# Patient Record
Sex: Male | Born: 1960 | Race: White | Hispanic: No
Health system: Southern US, Community
[De-identification: ages and names within clinical notes are randomized; demographics above are authoritative.]

---

## 2006-03-08 ENCOUNTER — Emergency Department: Payer: Self-pay | Admitting: Emergency Medicine

## 2008-09-15 IMAGING — CT CT ABD-PELV W/O CM
1 of 2 series · 15 of 32 positions shown, 19 images · non-contrast
Comparison: none

REASON FOR EXAM: (1) left flank pain r/o stone -rm 7; (2) left flank pain
r/o stone-rm 7
COMMENTS:

PROCEDURE:     CT  - CT ABDOMEN AND PELVIS W[DATE] [DATE]
RESULT:
HISTORY: LEFT flank pain.

[Series 2: soft tissue · axial · 0.79mm/px · z∈[-682,-226]mm · 15 of 63 slices shown, 19 images]
[im 3/63  soft-tissue]
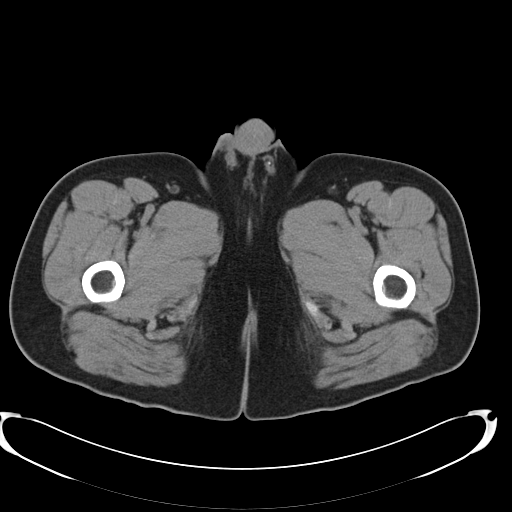
[im 3/63  bone]
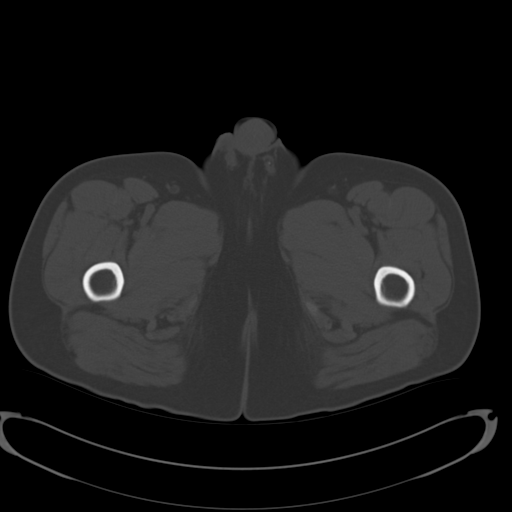
[im 9/63  soft-tissue]
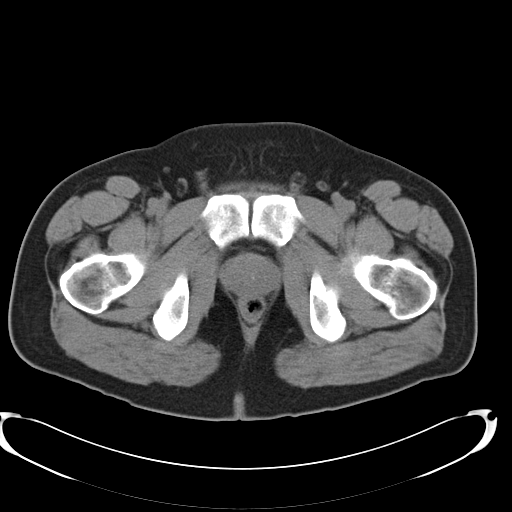
[im 14/63  soft-tissue]
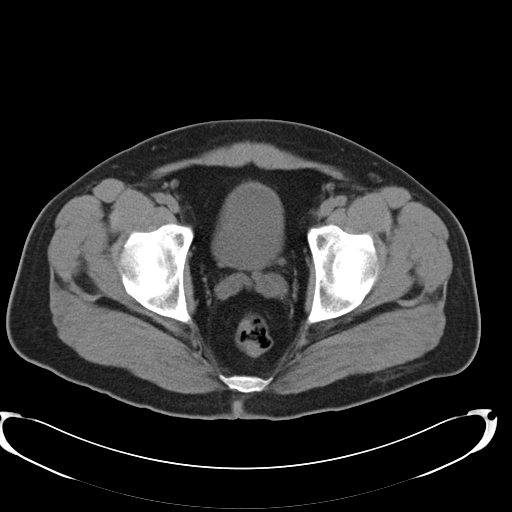
[im 17/63  soft-tissue]
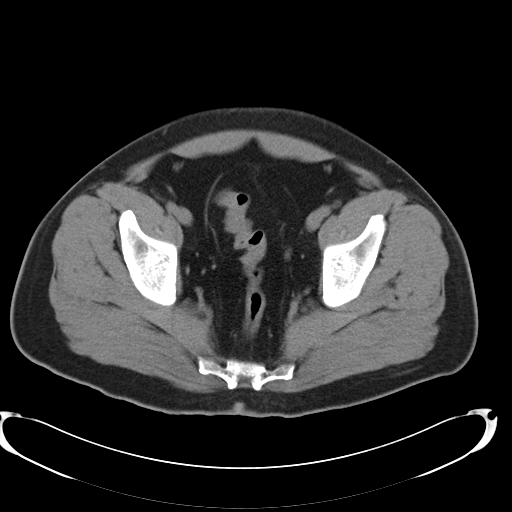
[im 22/63  soft-tissue]
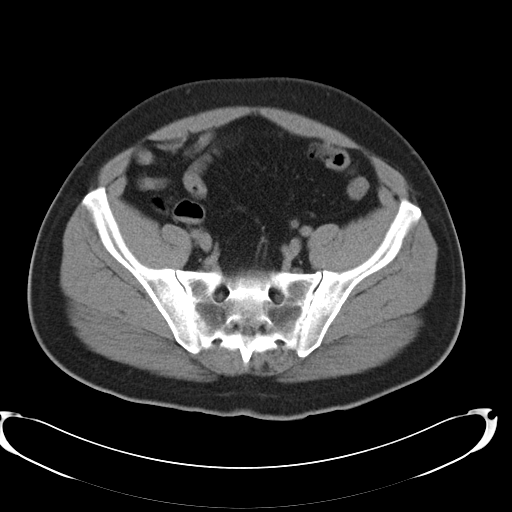
[im 27/63  soft-tissue]
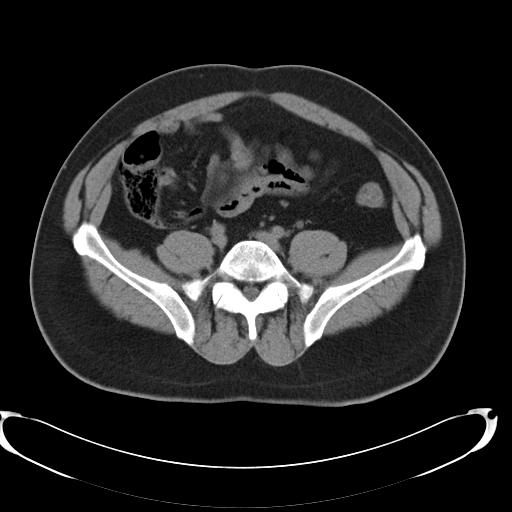
[im 33/63  soft-tissue]
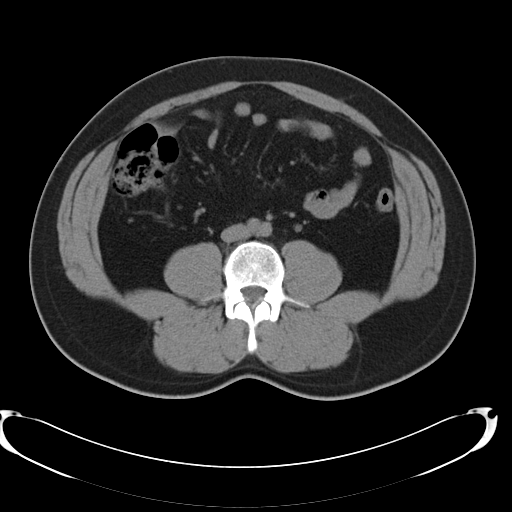
[im 36/63  soft-tissue]
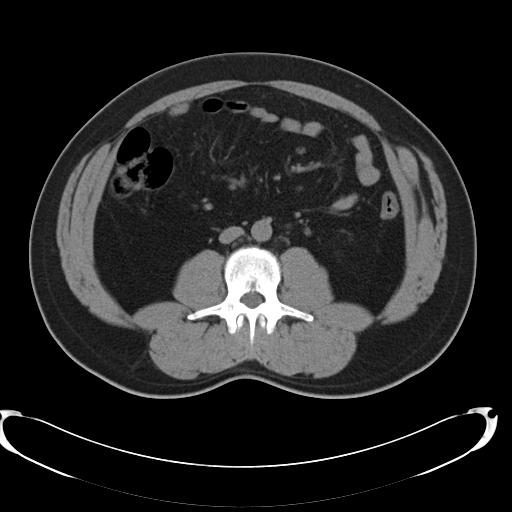
[im 41/63  soft-tissue]
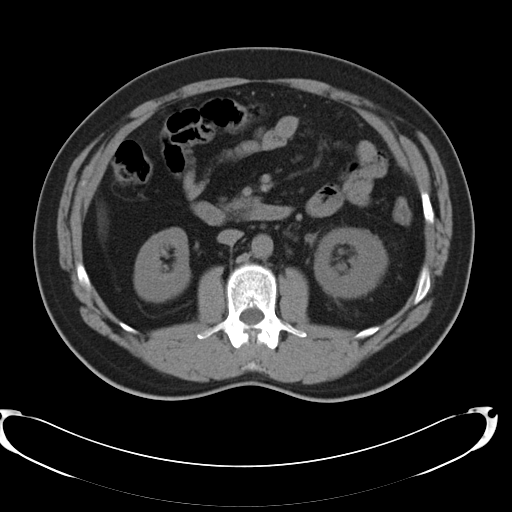
[im 41/63  bone]
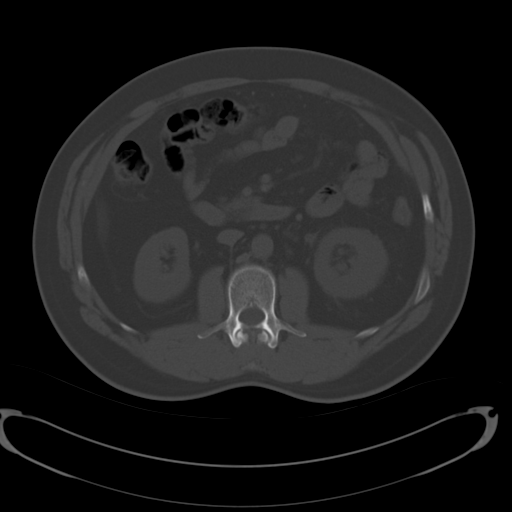
[im 46/63  soft-tissue]
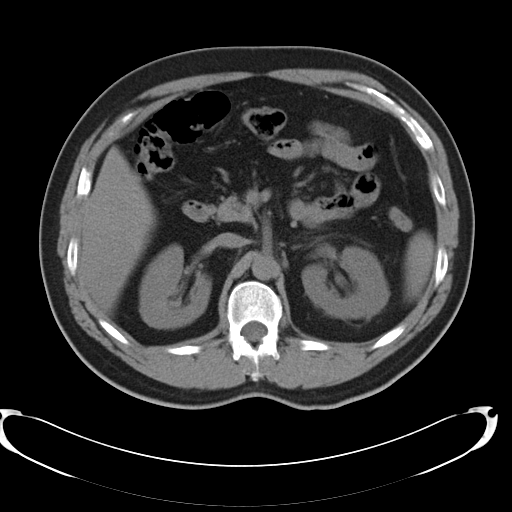
[im 49/63  soft-tissue]
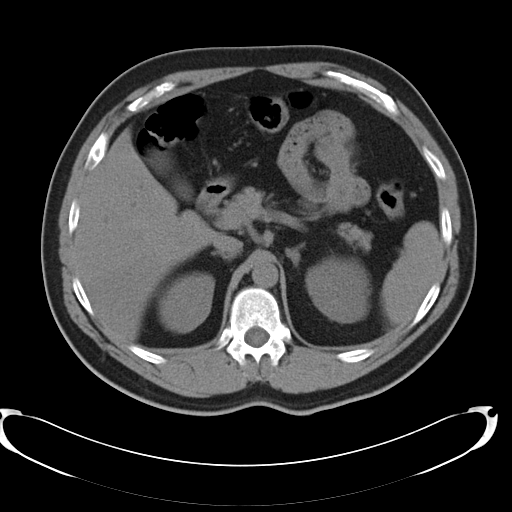
[im 52/63  lung]
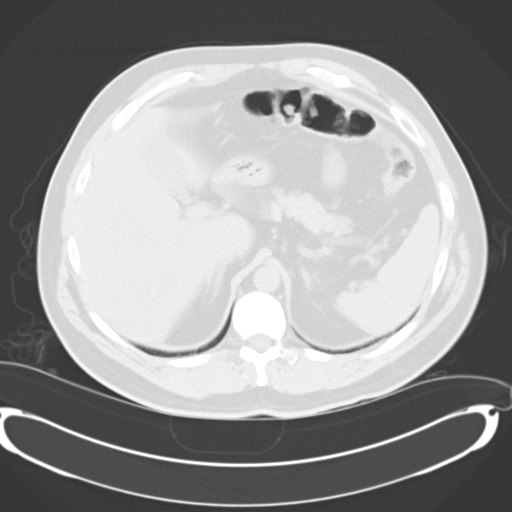
[im 54/63  soft-tissue]
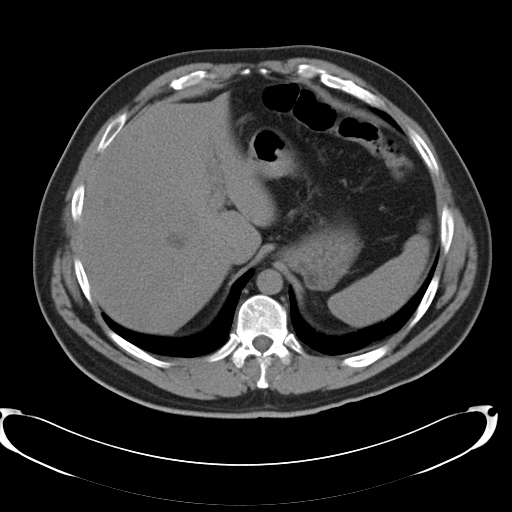
[im 54/63  lung]
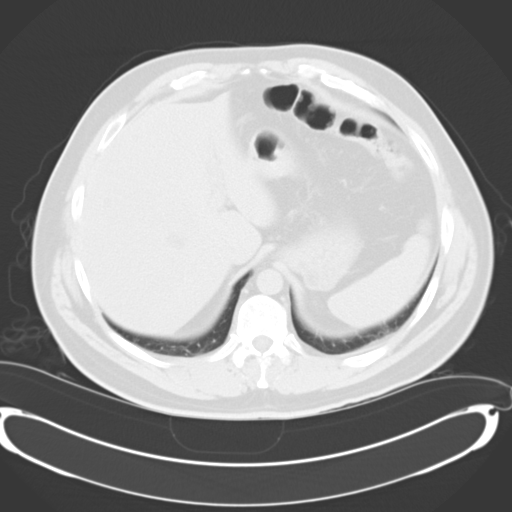
[im 57/63  lung]
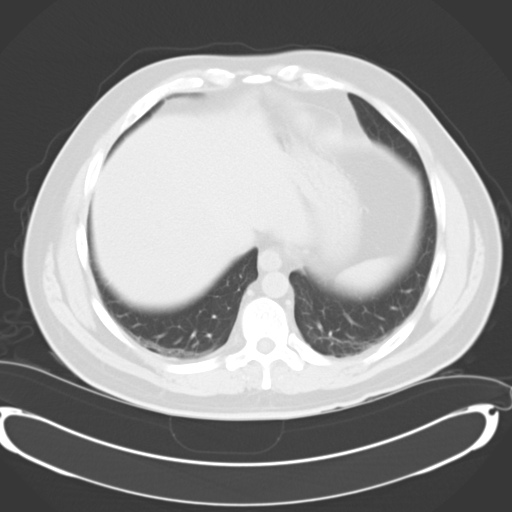
[im 60/63  soft-tissue]
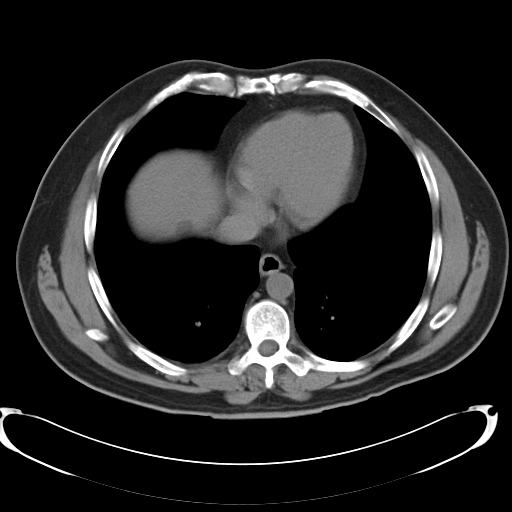
[im 60/63  lung]
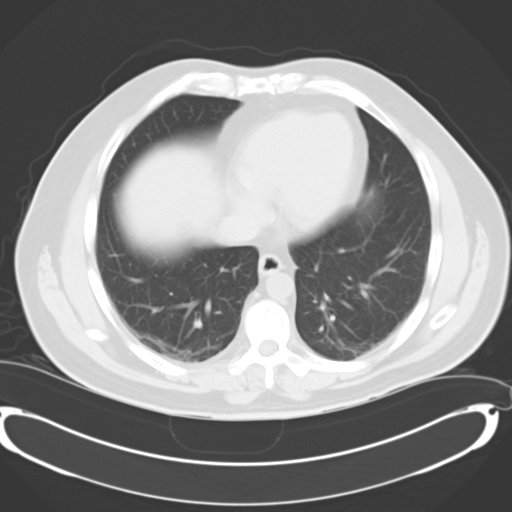

[15 of 32 positions shown; findings below may reference images not displayed]

COMPARISON STUDIES:  None.

PROCEDURE AND FINDINGS:  Standard nonenhanced CT of the abdomen and pelvis
is obtained.  Multiple cysts are noted in the liver.  The gallbladder is
nondistended.  Pancreas is normal.  The adrenals are normal.  The kidneys
are normal.  A tiny stone is noted in the bladder just adjacent to the
ureterovesical junction on the LEFT but there is mild LEFT ureteral
dilatation.  These findings are consistent with recently passed stone.
Retroperitoneum is unremarkable.  There is no bowel distention.  The RIGHT
lower quadrant is unremarkable.  The appendix is normal.  Lung bases are
normal.
IMPRESSION: Recently passed LEFT ureteral stone.  This report was phoned to the
patient's physician at the time of the study.

## 2021-06-13 ENCOUNTER — Other Ambulatory Visit: Payer: Self-pay | Admitting: Hematology and Oncology

## 2021-06-23 ENCOUNTER — Other Ambulatory Visit: Payer: Self-pay | Admitting: Hematology and Oncology

## 2021-08-24 ENCOUNTER — Ambulatory Visit: Payer: BC Managed Care – PPO | Admitting: Urology

## 2021-08-24 ENCOUNTER — Encounter: Payer: Self-pay | Admitting: Urology

## 2021-08-24 VITALS — BP 157/79 | HR 112 | Ht 70.0 in | Wt 224.0 lb

## 2021-08-24 DIAGNOSIS — N400 Enlarged prostate without lower urinary tract symptoms: Secondary | ICD-10-CM | POA: Diagnosis not present

## 2021-08-24 DIAGNOSIS — R972 Elevated prostate specific antigen [PSA]: Secondary | ICD-10-CM

## 2021-08-24 NOTE — Progress Notes (Signed)
   08/24/2021 2:02 PM   Trudi Ida 1960-12-14 [id number]  Referring provider: Wilford Corner, PA-C [address] [place],  [place] [address]  Chief Complaint  Patient presents with   Elevated PSA    HPI: Jeffrey Avila is a 61 y.o. male referred for evaluation of an elevated PSA.  PSA 08/02/2021 was 18.59 Urinalysis was unremarkable No previous PSAs for comparison on chart review No family history prostate cancer   PMH: No past medical history on file.  Surgical History: None  Home Medications:  Allergies as of 08/24/2021   No Known Allergies      Medication List        Accurate as of August 24, 2021  2:02 PM. If you have any questions, ask your nurse or doctor.          losartan 25 MG tablet Commonly known as: COZAAR Take 25 mg by mouth daily.   metFORMIN 500 MG tablet Commonly known as: GLUCOPHAGE Take by mouth.        Allergies: No Known Allergies  Family History: No family history on file.  Social History:  has no history on file for tobacco use, alcohol use, and drug use.   Physical Exam: BP (!) 157/79   Pulse (!) 112   Ht 5\' 10"  (1.778 m)   Wt 224 lb (101.6 kg)   BMI 32.14 kg/m   Constitutional:  Alert, No acute distress. HEENT: [place] AT Respiratory: Normal respiratory effort, no increased work of breathing. GU: Prostate 60 g, smooth without nodules Psychiatric: Normal mood and affect.   Assessment & Plan:    1.  Elevated PSA Significant PSA elevation We did discuss PSA levels that are >10 or more concerning for prostate cancer DRE is benign PSA repeated today to verify the significant elevated and if persistently elevated recommend scheduling prostate biopsy The procedure was discussed in detail including potential risks of bleeding and infection/sepsis Consider prostate MRI-if prostate biopsy positive will need MRI for staging and abnormal MRI could aid in fusion versus cognitive biopsy   ,  MD  [facility] [address] [place], [place] [phone]

## 2021-08-25 ENCOUNTER — Other Ambulatory Visit: Payer: Self-pay | Admitting: Urology

## 2021-08-25 ENCOUNTER — Telehealth: Payer: Self-pay | Admitting: Family Medicine

## 2021-08-25 DIAGNOSIS — R972 Elevated prostate specific antigen [PSA]: Secondary | ICD-10-CM

## 2021-08-25 LAB — PSA: Prostate Specific Ag, Serum: 22.5 ng/mL — ABNORMAL HIGH (ref 0.0–4.0)

## 2021-08-25 NOTE — Telephone Encounter (Signed)
Patient notified and voiced understanding. Biopsy will be scheduled after MRI is complete.

## 2021-08-25 NOTE — Telephone Encounter (Signed)
-----   Message from Riki Altes, MD sent at 08/25/2021  7:24 AM EDT ----- Repeat PSA persistently elevated at 22.5.  Recommend scheduling MRI prior to biopsy.  An order was entered and will notify with results

## 2021-09-12 ENCOUNTER — Ambulatory Visit
Admission: RE | Admit: 2021-09-12 | Discharge: 2021-09-12 | Disposition: A | Discharge status: Home / Self Care | Payer: BC Managed Care – PPO | Source: Ambulatory Visit | Attending: Urology | Admitting: Urology

## 2021-09-12 DIAGNOSIS — R972 Elevated prostate specific antigen [PSA]: Secondary | ICD-10-CM | POA: Diagnosis not present

## 2021-09-12 MED ORDER — GADOBUTROL 1 MMOL/ML IV SOLN
10.0000 mL | Freq: Once | INTRAVENOUS | Status: AC | PRN
Start: 2021-09-12 — End: 2021-09-12
  Administered 2021-09-12: 10 mL via INTRAVENOUS

## 2021-09-14 ENCOUNTER — Encounter: Payer: Self-pay | Admitting: Urology

## 2021-09-14 ENCOUNTER — Other Ambulatory Visit: Payer: Self-pay | Admitting: Urology

## 2021-09-14 DIAGNOSIS — R972 Elevated prostate specific antigen [PSA]: Secondary | ICD-10-CM

## 2021-11-07 ENCOUNTER — Encounter: Payer: Self-pay | Admitting: Urology

## 2021-11-14 ENCOUNTER — Telehealth: Payer: Self-pay | Admitting: Urology

## 2021-11-14 NOTE — Telephone Encounter (Signed)
I contacted Mr. Jeffrey Avila to discuss prostate biopsy results.  His prostate volume by TRUS was 130 g.  He underwent 4 bx /ROI 1 and 2+ a standard 12 core template biopsy for total of 20 cores.  All core showed benign prostate tissue.  No bothersome lower urinary tract symptoms  Recommend 67-month follow-up with PSA.  All questions were answered

## 2021-11-15 ENCOUNTER — Encounter: Payer: Self-pay | Admitting: *Deleted

## 2022-05-15 ENCOUNTER — Other Ambulatory Visit: Payer: BC Managed Care – PPO

## 2022-05-17 ENCOUNTER — Ambulatory Visit: Payer: BC Managed Care – PPO | Admitting: Urology
